# Patient Record
Sex: Female | Born: 1950 | Race: White | Hispanic: No | Marital: Single
Health system: Southern US, Community
[De-identification: ages and names within clinical notes are randomized; demographics above are authoritative.]

---

## 2008-03-29 ENCOUNTER — Ambulatory Visit: Payer: Self-pay | Admitting: Family Medicine

## 2008-04-04 ENCOUNTER — Ambulatory Visit: Payer: Self-pay | Admitting: Internal Medicine

## 2009-06-28 ENCOUNTER — Ambulatory Visit: Payer: Self-pay | Admitting: Unknown Physician Specialty

## 2011-03-28 ENCOUNTER — Ambulatory Visit: Payer: Self-pay | Admitting: Family Medicine

## 2012-02-16 ENCOUNTER — Ambulatory Visit: Payer: Self-pay | Admitting: Family Medicine

## 2012-02-21 ENCOUNTER — Ambulatory Visit: Payer: Self-pay

## 2012-02-21 LAB — CBC WITH DIFFERENTIAL/PLATELET
Basophil #: 0 10*3/uL (ref 0.0–0.1)
Basophil %: 0.5 %
Eosinophil #: 0.1 10*3/uL (ref 0.0–0.7)
Eosinophil %: 1.6 %
HCT: 37.4 % (ref 35.0–47.0)
Lymphocyte #: 1 10*3/uL (ref 1.0–3.6)
Lymphocyte %: 16 %
MCH: 30 pg (ref 26.0–34.0)
MCHC: 32.9 g/dL (ref 32.0–36.0)
MCV: 91 fL (ref 80–100)
Monocyte #: 0.5 x10 3/mm (ref 0.2–0.9)
Neutrophil #: 4.8 10*3/uL (ref 1.4–6.5)
Platelet: 311 10*3/uL (ref 150–440)
RBC: 4.09 10*6/uL (ref 3.80–5.20)
WBC: 6.5 10*3/uL (ref 3.6–11.0)

## 2012-02-21 LAB — LIPID PANEL
Cholesterol: 179 mg/dL (ref 0–200)
Ldl Cholesterol, Calc: 78 mg/dL (ref 0–100)
Triglycerides: 138 mg/dL (ref 0–200)

## 2012-02-21 LAB — COMPREHENSIVE METABOLIC PANEL
BUN: 14 mg/dL (ref 7–18)
Chloride: 98 mmol/L (ref 98–107)
Co2: 28 mmol/L (ref 21–32)
Creatinine: 0.81 mg/dL (ref 0.60–1.30)
EGFR (African American): >60
EGFR (Non-African Amer.): >60
Glucose: 92 mg/dL (ref 65–99)
Potassium: 4.6 mmol/L (ref 3.5–5.1)
SGOT(AST): 22 U/L (ref 15–37)
SGPT (ALT): 15 U/L
Total Protein: 6.9 g/dL (ref 6.4–8.2)

## 2012-02-21 LAB — FERRITIN: Ferritin (ARMC): 160 ng/mL (ref 8–388)

## 2012-02-21 LAB — FOLATE: Folic Acid: 19.6 ng/mL (ref 3.1–100.0)

## 2012-02-21 LAB — HEMOGLOBIN A1C: Hemoglobin A1C: 5.1 % (ref 4.2–6.3)

## 2012-07-22 ENCOUNTER — Ambulatory Visit: Payer: Self-pay

## 2012-07-22 LAB — COMPREHENSIVE METABOLIC PANEL
Albumin: 3.2 g/dL — ABNORMAL LOW (ref 3.4–5.0)
Alkaline Phosphatase: 117 U/L (ref 50–136)
Anion Gap: 7 (ref 7–16)
BUN: 15 mg/dL (ref 7–18)
Calcium, Total: 9.4 mg/dL (ref 8.5–10.1)
Chloride: 103 mmol/L (ref 98–107)
Glucose: 89 mg/dL (ref 65–99)
Potassium: 4 mmol/L (ref 3.5–5.1)
SGOT(AST): 14 U/L — ABNORMAL LOW (ref 15–37)
SGPT (ALT): 17 U/L (ref 12–78)
Total Protein: 6.9 g/dL (ref 6.4–8.2)

## 2012-07-22 LAB — CBC WITH DIFFERENTIAL/PLATELET
Basophil %: 0.7 %
Eosinophil %: 1.9 %
HGB: 11.7 g/dL — ABNORMAL LOW (ref 12.0–16.0)
Lymphocyte #: 1.5 10*3/uL (ref 1.0–3.6)
MCH: 31.7 pg (ref 26.0–34.0)
MCV: 94 fL (ref 80–100)
Monocyte #: 0.4 x10 3/mm (ref 0.2–0.9)
Monocyte %: 8.3 %
Neutrophil #: 2.6 10*3/uL (ref 1.4–6.5)
Platelet: 322 10*3/uL (ref 150–440)
RBC: 3.7 10*6/uL — ABNORMAL LOW (ref 3.80–5.20)

## 2012-07-22 LAB — LIPID PANEL
Cholesterol: 182 mg/dL (ref 0–200)
HDL Cholesterol: 82 mg/dL — ABNORMAL HIGH (ref 40–60)
Triglycerides: 101 mg/dL (ref 0–200)

## 2012-07-22 LAB — HEMOGLOBIN A1C: Hemoglobin A1C: 5.5 % (ref 4.2–6.3)

## 2012-07-22 LAB — FOLATE: Folic Acid: 18.8 ng/mL (ref 3.1–100.0)

## 2013-01-17 ENCOUNTER — Emergency Department: Payer: Self-pay | Admitting: Internal Medicine

## 2013-01-17 LAB — COMPREHENSIVE METABOLIC PANEL
Alkaline Phosphatase: 100 U/L (ref 50–136)
BUN: 11 mg/dL (ref 7–18)
Bilirubin,Total: 0.7 mg/dL (ref 0.2–1.0)
Calcium, Total: 9.6 mg/dL (ref 8.5–10.1)
Creatinine: 0.94 mg/dL (ref 0.60–1.30)
EGFR (African American): >60
EGFR (Non-African Amer.): >60
Glucose: 91 mg/dL (ref 65–99)
Potassium: 3.5 mmol/L (ref 3.5–5.1)
SGOT(AST): 22 U/L (ref 15–37)
SGPT (ALT): 21 U/L (ref 12–78)
Sodium: 142 mmol/L (ref 136–145)

## 2013-01-17 LAB — URINALYSIS, COMPLETE
Bacteria: NONE SEEN
Bilirubin,UR: NEGATIVE
Leukocyte Esterase: NEGATIVE
Nitrite: NEGATIVE
Ph: 8 (ref 4.5–8.0)
Protein: NEGATIVE
Squamous Epithelial: <1
WBC UR: 4 /HPF (ref 0–5)

## 2013-01-17 LAB — CBC
HGB: 14.1 g/dL (ref 12.0–16.0)
MCH: 31.1 pg (ref 26.0–34.0)
MCHC: 34.1 g/dL (ref 32.0–36.0)
MCV: 91 fL (ref 80–100)
RDW: 13.5 % (ref 11.5–14.5)
WBC: 7 10*3/uL (ref 3.6–11.0)

## 2013-11-20 ENCOUNTER — Ambulatory Visit: Payer: Self-pay | Admitting: Gynecologic Oncology

## 2013-11-24 ENCOUNTER — Ambulatory Visit: Payer: Self-pay | Admitting: Gynecologic Oncology

## 2013-11-26 LAB — CA 125: CA 125: 11.8 U/mL (ref 0.0–34.0)

## 2013-12-01 ENCOUNTER — Ambulatory Visit: Payer: Self-pay | Admitting: Gynecologic Oncology

## 2013-12-01 LAB — CBC
HCT: 33.2 % — AB (ref 35.0–47.0)
HGB: 11.3 g/dL — AB (ref 12.0–16.0)
MCH: 32.4 pg (ref 26.0–34.0)
MCHC: 34.2 g/dL (ref 32.0–36.0)
MCV: 95 fL (ref 80–100)
Platelet: 305 10*3/uL (ref 150–440)
RBC: 3.5 10*6/uL — ABNORMAL LOW (ref 3.80–5.20)
RDW: 12.8 % (ref 11.5–14.5)
WBC: 6.1 10*3/uL (ref 3.6–11.0)

## 2013-12-01 LAB — BASIC METABOLIC PANEL
Anion Gap: 5 — ABNORMAL LOW (ref 7–16)
BUN: 10 mg/dL (ref 7–18)
CO2: 27 mmol/L (ref 21–32)
Calcium, Total: 9.2 mg/dL (ref 8.5–10.1)
Chloride: 106 mmol/L (ref 98–107)
Creatinine: 0.74 mg/dL (ref 0.60–1.30)
EGFR (African American): >60
EGFR (Non-African Amer.): >60
Glucose: 92 mg/dL (ref 65–99)
Osmolality: 274 (ref 275–301)
Potassium: 4.3 mmol/L (ref 3.5–5.1)
SODIUM: 138 mmol/L (ref 136–145)

## 2013-12-06 ENCOUNTER — Ambulatory Visit: Payer: Self-pay | Admitting: Gynecologic Oncology

## 2013-12-08 ENCOUNTER — Inpatient Hospital Stay: Payer: Self-pay | Admitting: Obstetrics & Gynecology

## 2013-12-09 LAB — CREATININE, SERUM
Creatinine: 0.95 mg/dL (ref 0.60–1.30)
EGFR (Non-African Amer.): >60

## 2013-12-09 LAB — HEMOGLOBIN: HGB: 12.2 g/dL (ref 12.0–16.0)

## 2013-12-16 LAB — PATHOLOGY REPORT

## 2014-01-03 ENCOUNTER — Ambulatory Visit: Payer: Self-pay | Admitting: Gynecologic Oncology

## 2014-01-04 ENCOUNTER — Ambulatory Visit: Payer: Self-pay | Admitting: Gynecologic Oncology

## 2014-01-13 ENCOUNTER — Ambulatory Visit: Payer: Self-pay | Admitting: Vascular Surgery

## 2014-01-21 LAB — CBC CANCER CENTER
BASOS ABS: 0 x10 3/mm (ref 0.0–0.1)
Basophil %: 0.7 %
EOS PCT: 8.9 %
Eosinophil #: 0.5 x10 3/mm (ref 0.0–0.7)
HCT: 32.5 % — AB (ref 35.0–47.0)
HGB: 10.8 g/dL — ABNORMAL LOW (ref 12.0–16.0)
Lymphocyte #: 1.9 x10 3/mm (ref 1.0–3.6)
Lymphocyte %: 37.3 %
MCH: 31 pg (ref 26.0–34.0)
MCHC: 33.1 g/dL (ref 32.0–36.0)
MCV: 94 fL (ref 80–100)
MONOS PCT: 11 %
Monocyte #: 0.6 x10 3/mm (ref 0.2–0.9)
NEUTROS ABS: 2.2 x10 3/mm (ref 1.4–6.5)
Neutrophil %: 42.1 %
PLATELETS: 290 x10 3/mm (ref 150–440)
RBC: 3.47 10*6/uL — AB (ref 3.80–5.20)
RDW: 13.6 % (ref 11.5–14.5)
WBC: 5.1 x10 3/mm (ref 3.6–11.0)

## 2014-01-21 LAB — COMPREHENSIVE METABOLIC PANEL
ALT: 17 U/L (ref 12–78)
Albumin: 3.3 g/dL — ABNORMAL LOW (ref 3.4–5.0)
Alkaline Phosphatase: 89 U/L
Anion Gap: 8 (ref 7–16)
BILIRUBIN TOTAL: 0.2 mg/dL (ref 0.2–1.0)
BUN: 12 mg/dL (ref 7–18)
CALCIUM: 9.2 mg/dL (ref 8.5–10.1)
CREATININE: 0.83 mg/dL (ref 0.60–1.30)
Chloride: 102 mmol/L (ref 98–107)
Co2: 28 mmol/L (ref 21–32)
EGFR (Non-African Amer.): >60
GLUCOSE: 102 mg/dL — AB (ref 65–99)
OSMOLALITY: 276 (ref 275–301)
Potassium: 4 mmol/L (ref 3.5–5.1)
SGOT(AST): 17 U/L (ref 15–37)
SODIUM: 138 mmol/L (ref 136–145)
TOTAL PROTEIN: 6.7 g/dL (ref 6.4–8.2)

## 2014-01-23 LAB — CA 125: CA 125: 6.4 U/mL (ref 0.0–34.0)

## 2014-01-28 LAB — CBC CANCER CENTER
Basophil #: 0 x10 3/mm (ref 0.0–0.1)
Basophil %: 0.6 %
EOS PCT: 15.3 %
Eosinophil #: 0.5 x10 3/mm (ref 0.0–0.7)
HCT: 30.3 % — ABNORMAL LOW (ref 35.0–47.0)
HGB: 10.2 g/dL — AB (ref 12.0–16.0)
LYMPHS ABS: 1.3 x10 3/mm (ref 1.0–3.6)
Lymphocyte %: 38 %
MCH: 31.7 pg (ref 26.0–34.0)
MCHC: 33.8 g/dL (ref 32.0–36.0)
MCV: 94 fL (ref 80–100)
MONO ABS: 0.1 x10 3/mm — AB (ref 0.2–0.9)
MONOS PCT: 2.4 %
NEUTROS ABS: 1.4 x10 3/mm (ref 1.4–6.5)
NEUTROS PCT: 43.7 %
Platelet: 203 x10 3/mm (ref 150–440)
RBC: 3.23 10*6/uL — ABNORMAL LOW (ref 3.80–5.20)
RDW: 12.9 % (ref 11.5–14.5)
WBC: 3.3 x10 3/mm — ABNORMAL LOW (ref 3.6–11.0)

## 2014-01-29 LAB — COMPREHENSIVE METABOLIC PANEL
ALT: 23 U/L (ref 12–78)
ANION GAP: 6 — AB (ref 7–16)
Albumin: 3.1 g/dL — ABNORMAL LOW (ref 3.4–5.0)
Alkaline Phosphatase: 83 U/L
BILIRUBIN TOTAL: 0.1 mg/dL — AB (ref 0.2–1.0)
BUN: 13 mg/dL (ref 7–18)
CALCIUM: 8.7 mg/dL (ref 8.5–10.1)
Chloride: 106 mmol/L (ref 98–107)
Co2: 31 mmol/L (ref 21–32)
Creatinine: 0.76 mg/dL (ref 0.60–1.30)
EGFR (African American): >60
GLUCOSE: 102 mg/dL — AB (ref 65–99)
Osmolality: 285 (ref 275–301)
Potassium: 3.9 mmol/L (ref 3.5–5.1)
SGOT(AST): 20 U/L (ref 15–37)
Sodium: 143 mmol/L (ref 136–145)
Total Protein: 6.2 g/dL — ABNORMAL LOW (ref 6.4–8.2)

## 2014-02-03 ENCOUNTER — Ambulatory Visit: Payer: Self-pay | Admitting: Gynecologic Oncology

## 2014-02-04 LAB — CBC CANCER CENTER
BASOS ABS: 0 x10 3/mm (ref 0.0–0.1)
Basophil %: 0.6 %
Eosinophil #: 0.1 x10 3/mm (ref 0.0–0.7)
Eosinophil %: 5.8 %
HCT: 27.9 % — ABNORMAL LOW (ref 35.0–47.0)
HGB: 9.4 g/dL — ABNORMAL LOW (ref 12.0–16.0)
Lymphocyte #: 1 x10 3/mm (ref 1.0–3.6)
Lymphocyte %: 46.7 %
MCH: 31.6 pg (ref 26.0–34.0)
MCHC: 33.7 g/dL (ref 32.0–36.0)
MCV: 94 fL (ref 80–100)
Monocyte #: 0.6 x10 3/mm (ref 0.2–0.9)
Monocyte %: 26.1 %
NEUTROS PCT: 20.8 %
Neutrophil #: 0.4 x10 3/mm — ABNORMAL LOW (ref 1.4–6.5)
PLATELETS: 131 x10 3/mm — AB (ref 150–440)
RBC: 2.98 10*6/uL — ABNORMAL LOW (ref 3.80–5.20)
RDW: 13 % (ref 11.5–14.5)
WBC: 2.1 x10 3/mm — ABNORMAL LOW (ref 3.6–11.0)

## 2014-02-11 LAB — COMPREHENSIVE METABOLIC PANEL
ALT: 13 U/L (ref 12–78)
Albumin: 3 g/dL — ABNORMAL LOW (ref 3.4–5.0)
Alkaline Phosphatase: 93 U/L
Anion Gap: 6 — ABNORMAL LOW (ref 7–16)
BUN: 10 mg/dL (ref 7–18)
Bilirubin,Total: 0.2 mg/dL (ref 0.2–1.0)
CALCIUM: 8.8 mg/dL (ref 8.5–10.1)
Chloride: 106 mmol/L (ref 98–107)
Co2: 29 mmol/L (ref 21–32)
Creatinine: 0.77 mg/dL (ref 0.60–1.30)
EGFR (African American): >60
EGFR (Non-African Amer.): >60
GLUCOSE: 92 mg/dL (ref 65–99)
Osmolality: 280 (ref 275–301)
Potassium: 4.1 mmol/L (ref 3.5–5.1)
SGOT(AST): 12 U/L — ABNORMAL LOW (ref 15–37)
Sodium: 141 mmol/L (ref 136–145)
Total Protein: 6.3 g/dL — ABNORMAL LOW (ref 6.4–8.2)

## 2014-02-11 LAB — CBC CANCER CENTER
BASOS PCT: 0.5 %
Basophil #: 0 x10 3/mm (ref 0.0–0.1)
Eosinophil #: 0.1 x10 3/mm (ref 0.0–0.7)
Eosinophil %: 1.6 %
HCT: 28.7 % — AB (ref 35.0–47.0)
HGB: 9.5 g/dL — ABNORMAL LOW (ref 12.0–16.0)
LYMPHS ABS: 1.7 x10 3/mm (ref 1.0–3.6)
Lymphocyte %: 48.5 %
MCH: 31 pg (ref 26.0–34.0)
MCHC: 33 g/dL (ref 32.0–36.0)
MCV: 94 fL (ref 80–100)
Monocyte #: 0.4 x10 3/mm (ref 0.2–0.9)
Monocyte %: 12.3 %
NEUTROS ABS: 1.3 x10 3/mm — AB (ref 1.4–6.5)
Neutrophil %: 37.1 %
PLATELETS: 360 x10 3/mm (ref 150–440)
RBC: 3.06 10*6/uL — AB (ref 3.80–5.20)
RDW: 13.5 % (ref 11.5–14.5)
WBC: 3.4 x10 3/mm — ABNORMAL LOW (ref 3.6–11.0)

## 2014-03-05 ENCOUNTER — Ambulatory Visit: Payer: Self-pay | Admitting: Gynecologic Oncology

## 2014-03-08 LAB — COMPREHENSIVE METABOLIC PANEL
ALBUMIN: 3.7 g/dL (ref 3.4–5.0)
Alkaline Phosphatase: 117 U/L
Anion Gap: 11 (ref 7–16)
BILIRUBIN TOTAL: 0.3 mg/dL (ref 0.2–1.0)
BUN: 11 mg/dL (ref 7–18)
Calcium, Total: 9.3 mg/dL (ref 8.5–10.1)
Chloride: 101 mmol/L (ref 98–107)
Co2: 27 mmol/L (ref 21–32)
Creatinine: 0.99 mg/dL (ref 0.60–1.30)
GLUCOSE: 108 mg/dL — AB (ref 65–99)
Osmolality: 277 (ref 275–301)
Potassium: 3.7 mmol/L (ref 3.5–5.1)
SGOT(AST): 21 U/L (ref 15–37)
SGPT (ALT): 24 U/L (ref 12–78)
Sodium: 139 mmol/L (ref 136–145)
TOTAL PROTEIN: 7.3 g/dL (ref 6.4–8.2)

## 2014-03-08 LAB — CBC CANCER CENTER
Basophil #: 0 x10 3/mm (ref 0.0–0.1)
Basophil %: 0.5 %
EOS ABS: 0 x10 3/mm (ref 0.0–0.7)
Eosinophil %: 0.6 %
HCT: 29.6 % — ABNORMAL LOW (ref 35.0–47.0)
HGB: 10.5 g/dL — ABNORMAL LOW (ref 12.0–16.0)
LYMPHS ABS: 1.9 x10 3/mm (ref 1.0–3.6)
Lymphocyte %: 36.1 %
MCH: 32.9 pg (ref 26.0–34.0)
MCHC: 35.3 g/dL (ref 32.0–36.0)
MCV: 93 fL (ref 80–100)
MONOS PCT: 8.2 %
Monocyte #: 0.4 x10 3/mm (ref 0.2–0.9)
Neutrophil #: 2.9 x10 3/mm (ref 1.4–6.5)
Neutrophil %: 54.6 %
PLATELETS: 502 x10 3/mm — AB (ref 150–440)
RBC: 3.18 10*6/uL — ABNORMAL LOW (ref 3.80–5.20)
RDW: 17.6 % — ABNORMAL HIGH (ref 11.5–14.5)
WBC: 5.2 x10 3/mm (ref 3.6–11.0)

## 2014-03-09 ENCOUNTER — Emergency Department: Payer: Self-pay | Admitting: Internal Medicine

## 2014-03-10 LAB — COMPREHENSIVE METABOLIC PANEL
ALK PHOS: 99 U/L
ALT: 22 U/L (ref 12–78)
Albumin: 3.6 g/dL (ref 3.4–5.0)
Anion Gap: 12 (ref 7–16)
BILIRUBIN TOTAL: 0.9 mg/dL (ref 0.2–1.0)
BUN: 14 mg/dL (ref 7–18)
Calcium, Total: 8.7 mg/dL (ref 8.5–10.1)
Chloride: 105 mmol/L (ref 98–107)
Co2: 24 mmol/L (ref 21–32)
Creatinine: 0.82 mg/dL (ref 0.60–1.30)
EGFR (African American): >60
Glucose: 132 mg/dL — ABNORMAL HIGH (ref 65–99)
Osmolality: 284 (ref 275–301)
Potassium: 3.4 mmol/L — ABNORMAL LOW (ref 3.5–5.1)
SGOT(AST): 17 U/L (ref 15–37)
SODIUM: 141 mmol/L (ref 136–145)
Total Protein: 6.8 g/dL (ref 6.4–8.2)

## 2014-03-10 LAB — CBC CANCER CENTER
BASOS ABS: 0 x10 3/mm (ref 0.0–0.1)
Basophil %: 0.2 %
EOS ABS: 0 x10 3/mm (ref 0.0–0.7)
EOS PCT: 0 %
HCT: 28 % — AB (ref 35.0–47.0)
HGB: 9.9 g/dL — AB (ref 12.0–16.0)
LYMPHS ABS: 0.2 x10 3/mm — AB (ref 1.0–3.6)
LYMPHS PCT: 4.5 %
MCH: 32.9 pg (ref 26.0–34.0)
MCHC: 35.4 g/dL (ref 32.0–36.0)
MCV: 93 fL (ref 80–100)
Monocyte #: 0.2 x10 3/mm (ref 0.2–0.9)
Monocyte %: 3.2 %
NEUTROS ABS: 4.6 x10 3/mm (ref 1.4–6.5)
NEUTROS PCT: 92.1 %
PLATELETS: 458 x10 3/mm — AB (ref 150–440)
RBC: 3.01 10*6/uL — AB (ref 3.80–5.20)
RDW: 18.1 % — AB (ref 11.5–14.5)
WBC: 5 x10 3/mm (ref 3.6–11.0)

## 2014-03-10 LAB — MAGNESIUM: MAGNESIUM: 1.7 mg/dL — AB

## 2014-04-05 ENCOUNTER — Ambulatory Visit: Payer: Self-pay | Admitting: Gynecologic Oncology

## 2014-05-05 ENCOUNTER — Ambulatory Visit: Payer: Self-pay | Admitting: Gynecologic Oncology

## 2014-06-11 ENCOUNTER — Ambulatory Visit: Payer: Self-pay | Admitting: Oncology

## 2014-07-06 ENCOUNTER — Ambulatory Visit: Payer: Self-pay | Admitting: Oncology

## 2014-07-08 ENCOUNTER — Ambulatory Visit: Payer: Self-pay | Admitting: Obstetrics and Gynecology

## 2014-09-05 ENCOUNTER — Ambulatory Visit: Payer: Self-pay | Admitting: Nurse Practitioner

## 2014-09-06 IMAGING — CT CT ABD-PELV W/ CM
2 of 5 series · 16 of 46 positions shown, 18 images · IV contrast (isovue)
Comparison: 11/26/2013

CLINICAL DATA: Vaginal bleeding. Pelvic and back pain. Weight loss,
nausea. History of uterine cancer diagnosed 5782. Post hysterectomy
and chemotherapy.

EXAM:
CT ABDOMEN AND PELVIS WITH CONTRAST
TECHNIQUE: Multidetector CT imaging of the abdomen and pelvis was performed
using the standard protocol following bolus administration of
intravenous contrast.
CONTRAST:  100 cc Isovue 300 IV.

[Series 2: axial soft tissue · axial · 0.78mm/px · z∈[-519,-154]mm · 13 of 83 slices shown, 15 images]
[im 5/83  soft-tissue]
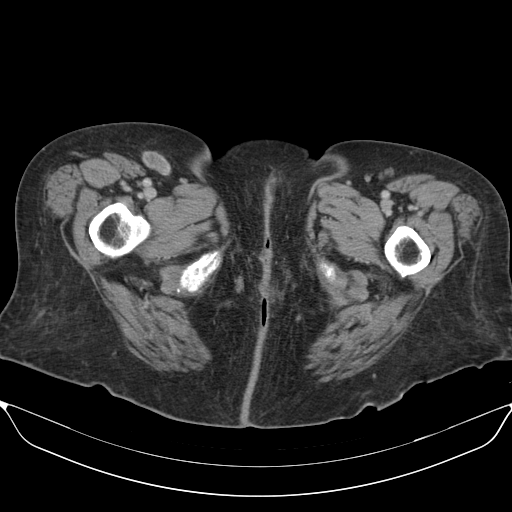
[im 5/83  bone]
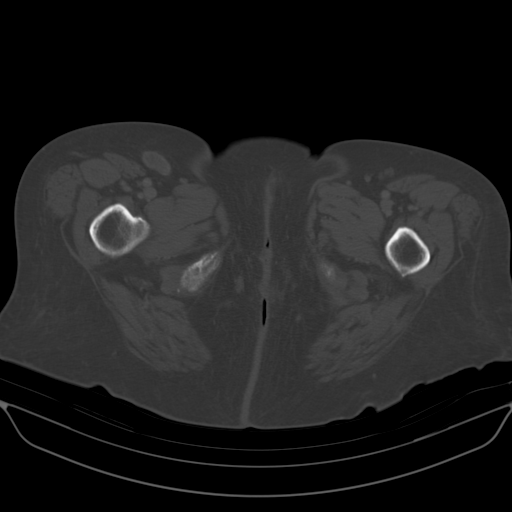
[im 13/83  soft-tissue]
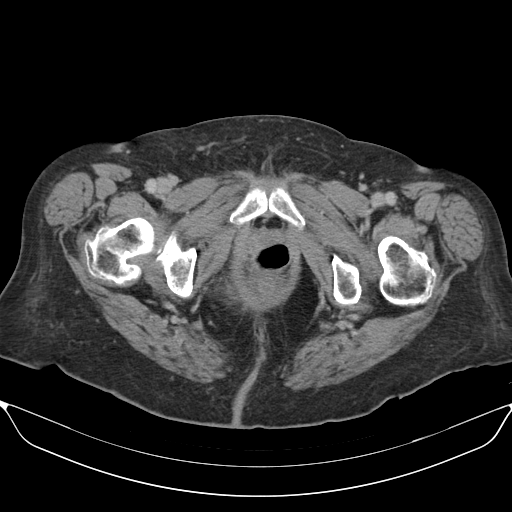
[im 18/83  soft-tissue]
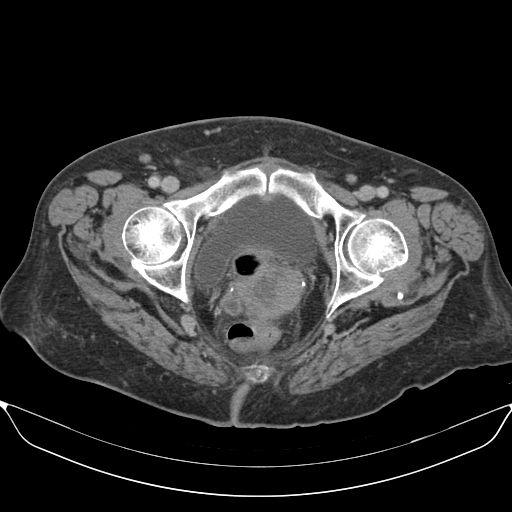
[im 22/83  soft-tissue]
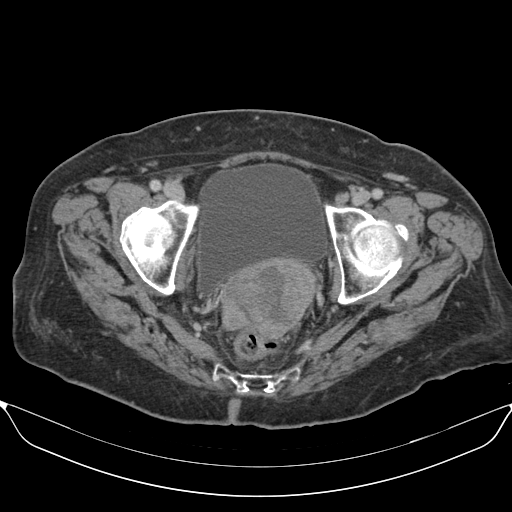
[im 31/83  soft-tissue]
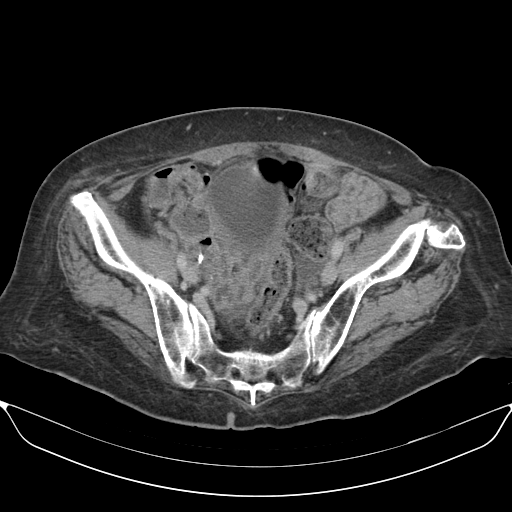
[im 35/83  soft-tissue]
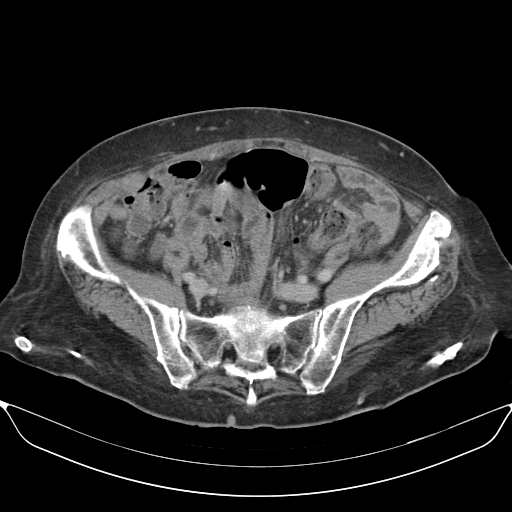
[im 44/83  soft-tissue]
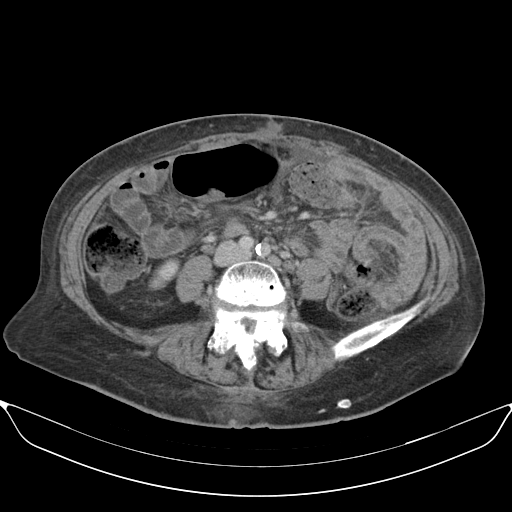
[im 48/83  soft-tissue]
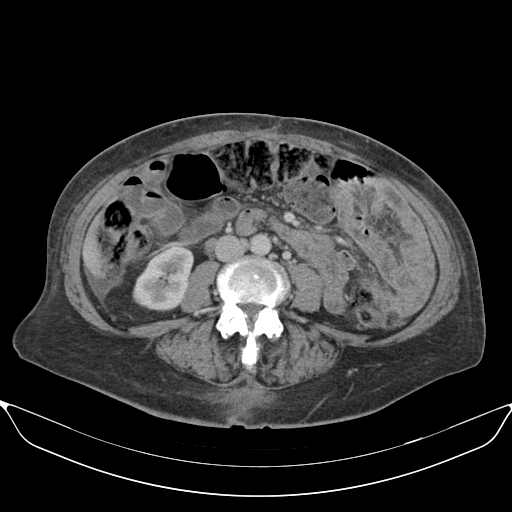
[im 52/83  soft-tissue]
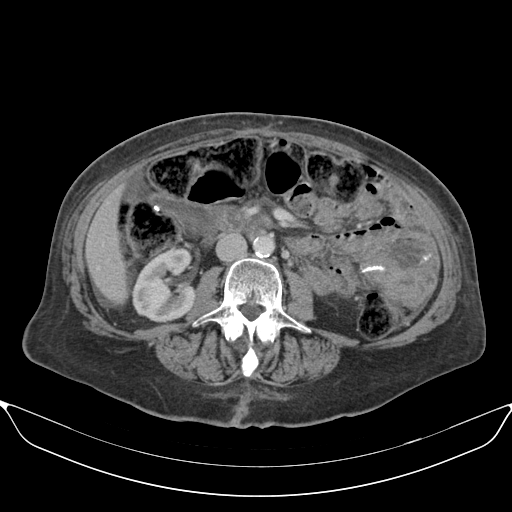
[im 52/83  bone]
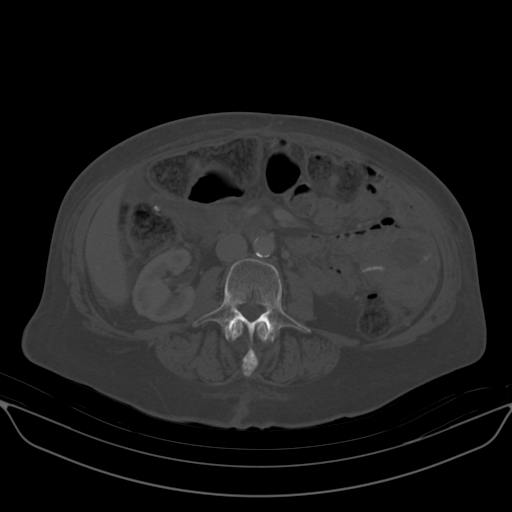
[im 61/83  soft-tissue]
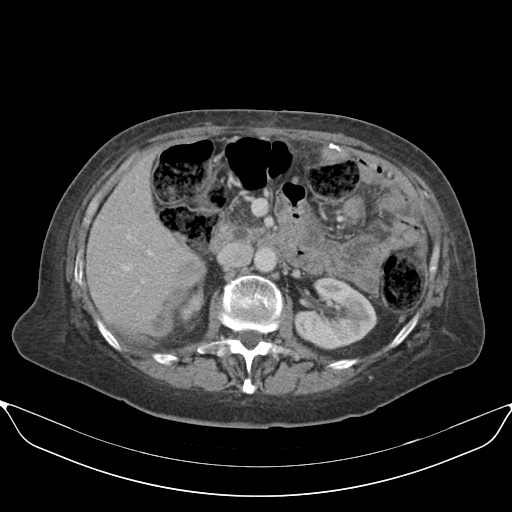
[im 65/83  soft-tissue]
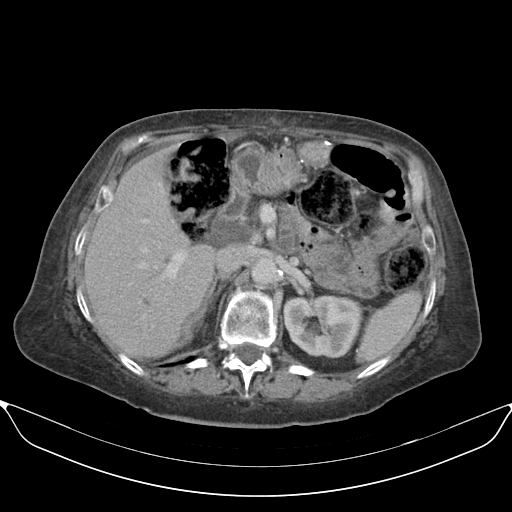
[im 70/83  soft-tissue]
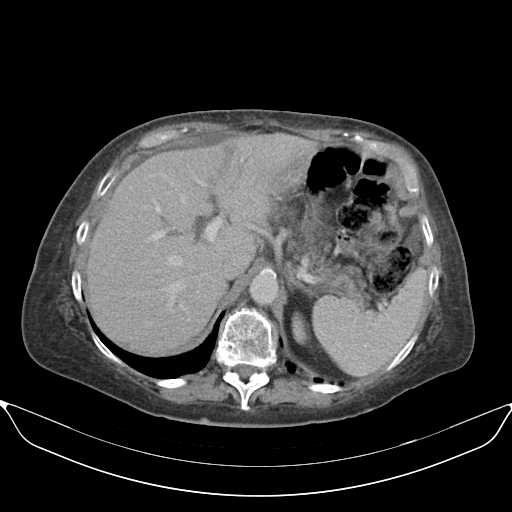
[im 78/83  soft-tissue]
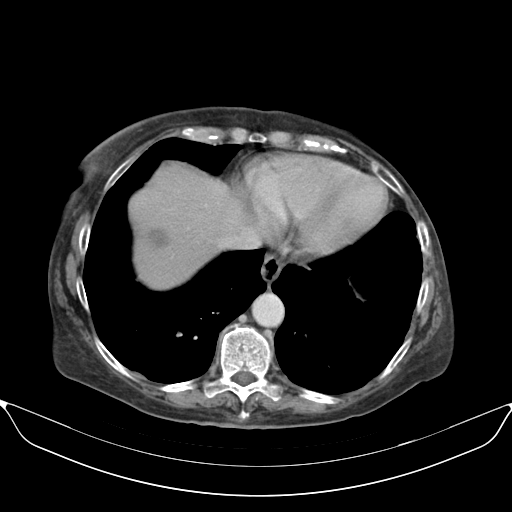

[Series 602: coronals · coronal · 0.80mm/px · 3 of 132 slices shown]
[im 44/132  soft-tissue]
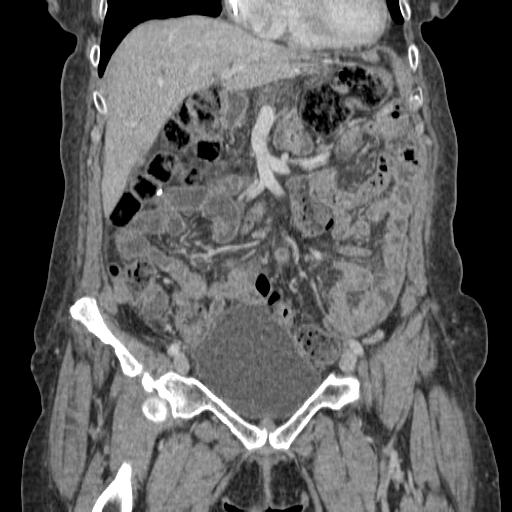
[im 59/132  soft-tissue]
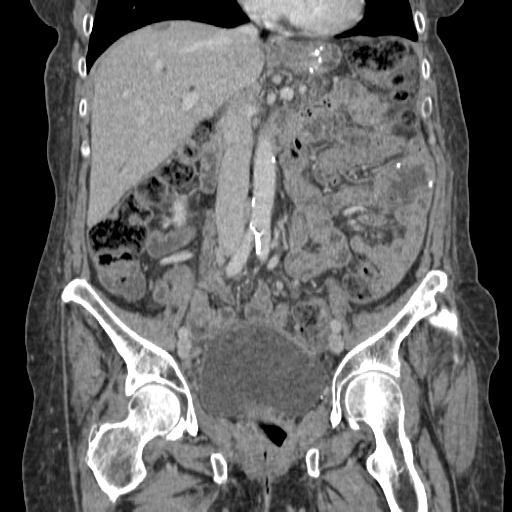
[im 73/132  soft-tissue]
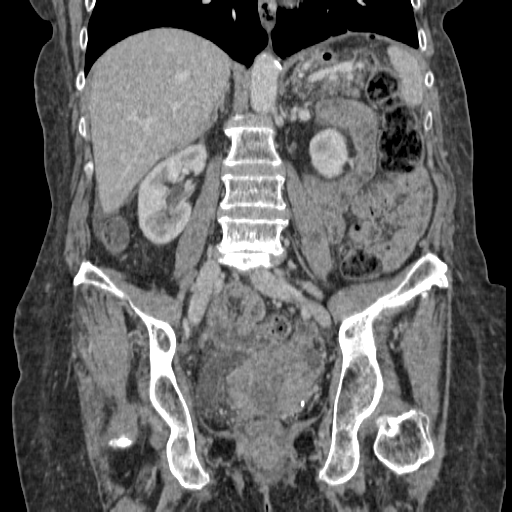

[16 of 46 positions shown; findings below may reference images not displayed]

FINDINGS: Lung bases are clear.  No effusions.  Heart is normal size.

Small low-density lesion anteriorly within the liver measures 10 mm
on image 11, stable. Small hypodensity posteriorly in the right
hepatic lobe measures 6 mm on image 19, stable since prior study.
Spleen, pancreas, adrenals and kidneys are unremarkable.

Postoperative changes in the stomach and small bowel, presumably
from prior gastric bypass. Stable mild biliary ductal dilatation.
Common bile duct measures up to 13 mm.

Abnormal low-density lesions noted along the liver edge compatible
with serosal metastases. These are best seen on image 24 and image
26. Index serosal metastasis on image 24 measures up to 2.2 cm.

Large heterogeneous mixed solid and cystic mass with in the
cul-de-sac of the pelvis measures 7.5 x 6.1 cm compatible with
recurrence. Mesenteric nodularity centrally measures 12 mm on image
41. Mesentery nodularity on image 23 in the left abdomen measures up
to 16 mm. Small amount of free fluid adjacent to the liver and in
the cul-de-sac.

Moderate stool burden throughout the colon. Small bowel is
decompressed.

Degenerative disc and facet disease in the lumbar spine. Mild
scratch sec grade 1 anterolisthesis of L4 on L5.
IMPRESSION: Large mixed solid and cystic mass within the cul-de-sac of the
pelvis measured up to 7.5 cm compatible with tumor recurrence.

Serosal mixed solid and cystic masses along the liver surface
compatible with peritoneal metastases. Scattered mesenteric
nodularity/adenopathy. Small amount of free fluid in the abdomen and
pelvis.

Two small hepatic lesions, stable since prior study, nonspecific.

## 2014-09-30 ENCOUNTER — Inpatient Hospital Stay: Payer: Self-pay | Admitting: Internal Medicine

## 2014-09-30 LAB — COMPREHENSIVE METABOLIC PANEL
ALK PHOS: 102 U/L
AST: 17 U/L (ref 15–37)
Albumin: 1.3 g/dL — ABNORMAL LOW (ref 3.4–5.0)
Anion Gap: 5 — ABNORMAL LOW (ref 7–16)
BILIRUBIN TOTAL: 0.3 mg/dL (ref 0.2–1.0)
BUN: 28 mg/dL — AB (ref 7–18)
CALCIUM: 8.3 mg/dL — AB (ref 8.5–10.1)
CHLORIDE: 102 mmol/L (ref 98–107)
Co2: 27 mmol/L (ref 21–32)
Creatinine: 0.99 mg/dL (ref 0.60–1.30)
EGFR (African American): >60
EGFR (Non-African Amer.): >60
GLUCOSE: 103 mg/dL — AB (ref 65–99)
OSMOLALITY: 274 (ref 275–301)
POTASSIUM: 4.5 mmol/L (ref 3.5–5.1)
SGPT (ALT): 13 U/L — ABNORMAL LOW
SODIUM: 134 mmol/L — AB (ref 136–145)
TOTAL PROTEIN: 4.8 g/dL — AB (ref 6.4–8.2)

## 2014-09-30 LAB — CBC
HCT: 33.3 % — ABNORMAL LOW (ref 35.0–47.0)
HGB: 10.7 g/dL — ABNORMAL LOW (ref 12.0–16.0)
MCH: 28.8 pg (ref 26.0–34.0)
MCHC: 32.3 g/dL (ref 32.0–36.0)
MCV: 89 fL (ref 80–100)
Platelet: 573 10*3/uL — ABNORMAL HIGH (ref 150–440)
RBC: 3.73 10*6/uL — ABNORMAL LOW (ref 3.80–5.20)
RDW: 15 % — ABNORMAL HIGH (ref 11.5–14.5)
WBC: 11.1 10*3/uL — ABNORMAL HIGH (ref 3.6–11.0)

## 2014-10-01 LAB — BASIC METABOLIC PANEL
Anion Gap: 5 — ABNORMAL LOW (ref 7–16)
BUN: 28 mg/dL — AB (ref 7–18)
CHLORIDE: 105 mmol/L (ref 98–107)
Calcium, Total: 7.8 mg/dL — ABNORMAL LOW (ref 8.5–10.1)
Co2: 27 mmol/L (ref 21–32)
Creatinine: 0.83 mg/dL (ref 0.60–1.30)
EGFR (Non-African Amer.): >60
GLUCOSE: 76 mg/dL (ref 65–99)
Osmolality: 278 (ref 275–301)
POTASSIUM: 4.2 mmol/L (ref 3.5–5.1)
Sodium: 137 mmol/L (ref 136–145)

## 2014-10-01 LAB — CBC WITH DIFFERENTIAL/PLATELET
Basophil #: 0 10*3/uL (ref 0.0–0.1)
Basophil %: 0.2 %
EOS ABS: 0 10*3/uL (ref 0.0–0.7)
EOS PCT: 0.1 %
HCT: 27.9 % — ABNORMAL LOW (ref 35.0–47.0)
HGB: 9.1 g/dL — AB (ref 12.0–16.0)
LYMPHS ABS: 0.3 10*3/uL — AB (ref 1.0–3.6)
LYMPHS PCT: 3.9 %
MCH: 29 pg (ref 26.0–34.0)
MCHC: 32.5 g/dL (ref 32.0–36.0)
MCV: 89 fL (ref 80–100)
MONOS PCT: 9.7 %
Monocyte #: 0.8 x10 3/mm (ref 0.2–0.9)
NEUTROS PCT: 86.1 %
Neutrophil #: 7.4 10*3/uL — ABNORMAL HIGH (ref 1.4–6.5)
Platelet: 423 10*3/uL (ref 150–440)
RBC: 3.12 10*6/uL — AB (ref 3.80–5.20)
RDW: 15.4 % — AB (ref 11.5–14.5)
WBC: 8.5 10*3/uL (ref 3.6–11.0)

## 2014-10-01 LAB — URINALYSIS, COMPLETE
Bilirubin,UR: NEGATIVE
Blood: NEGATIVE
Glucose,UR: NEGATIVE mg/dL (ref 0–75)
KETONE: NEGATIVE
NITRITE: NEGATIVE
Ph: 5 (ref 4.5–8.0)
Protein: 30
RBC,UR: 36 /HPF (ref 0–5)
Specific Gravity: 1.029 (ref 1.003–1.030)
Squamous Epithelial: 6
WBC UR: 46 /HPF (ref 0–5)

## 2014-11-05 DEATH — deceased

## 2015-02-26 NOTE — Op Note (Signed)
PATIENT NAME:  [name] [name] MR#:  [id number] DATE OF BIRTH:  May 11, 1951  DATE OF PROCEDURE:  12/08/2013  SURGEON: Maxine Glenn, MD  PREOPERATIVE DIAGNOSIS: Adenocarcinoma of the endometrium.  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  PROCEDURE PERFORMED: Laparoscopy with robotic assistance for total laparoscopic hysterectomy, bilateral salpingo-oophorectomy, pelvic lymphadenectomy, mini-laparotomy for removal of the uterus and partial omentectomy.   ANESTHESIA: General.   ESTIMATED BLOOD LOSS: 75 mL.   COMPLICATIONS: None.  INDICATION FOR SURGERY: [name] [name] is a 64 year old patient who complained of postmenopausal bleeding, and on endometrial biopsy, was noted to have a type II endometrioid adenocarcinoma. Workup failed to reveal any evidence of metastatic disease. Therefore, a decision was made to proceed with surgery.  OPERATIVE REPORT: After adequate general anesthesia had been obtained, the patient was prepped and draped in ski position. The cervix was visualized, grasped with a single-tooth tenaculum and dilated. A holding stitch was placed. Then, the VCare was inserted into the uterus and around the cervix. The introitus was quite narrow. Then, a Foley catheter was inserted.   Attention was directed towards the abdomen. A 1 cm incision was placed above the umbilicus. The fascia was identified and grasped and transected. The peritoneum was identified and entered. Then, the blunt trocar was inserted. Pneumoperitoneum was obtained, and inspection done with the above-mentioned findings. Then, under direct vision, 2 robotic trocars were inserted into the mid abdomen, a fourth one into the left lower quadrant and a 12 mm assistant port into the right lower quadrant. The patient was placed in steep Trendelenburg position, and the bowel was pushed away from the pelvis. The patient was attached to the robot.   Pelvic cytology was obtained.  Adhesions between sigmoid and left pelvic sidewall were lysed. The round ligament on the left side was cauterized and transected. The pelvic sidewall was entered. Vessel and ureter were identified. The infundibulopelvic ligament was cauterized and transected. The adnexa were mobilized all the way towards the uterus. The same procedure was performed on the right side. Then, the anterior fold of the peritoneum was incised, and the bladder was freed from lower uterine segment, cervix and upper vagina. The posterior peritoneum was incised and freed from lower uterine segment, cervix and upper vagina. The uterosacral ligaments were transected. The uterine vessels were skeletonized, cauterized and transected. Then, the vagina was entered anteriorly, and incision was carried all the way around until uterus and adnexa could be completely mobilized. It was impossible to remove the specimen through the vagina. Therefore, it was placed in an EndoCatch bag and put in the upper abdomen.   Attention was then directed towards the lymphadenectomy, which was done in similar fashion on either side. Using the cauterizing scissors, the node-bearing fatty tissue around lower common iliac and external iliac vessels, from around the hypogastric artery and from the obturator space was removed. Vessels, ureter and obturator nerve were kept under constant visualization. Hemostasis was adequate at the end of the procedure. The vagina was closed with a running V-Loc suture starting on the right side, going all the way to the left and returning with a few back stitches. Adequate hemostasis was confirmed. Biopsies were taken from the pelvic peritoneum.   The patient was then detached from the robot. All trocars were removed under direct vision. There was no evidence of bleeding. The camera port incision was enlarged. Inspection and palpation of the upper abdomen was unremarkable. The bag containing the uterus was removed. Palpation of the  periaortic area was normal, and omental biopsy was taken by dissecting pedicles, which were cut and ligated with 0 Vicryl. Hemostasis was noted to be adequate.   The incision was closed with running #1 looped PDS suture. The subcutaneous tissue was reapproximated with 2-0 Vicryl, and the skin was closed with a 3-0 Monocryl subcuticular stitch. The port sites were reapproximated with 3-0 Vicryl, and the skin was closed with Dermabond.   The patient tolerated the procedure well and was taken to recovery room in stable condition. Postoperative urine was clear. The sponge, needle and instrument counts were correct x2.   ____________________________ Maxine GlennBrigitte E. Zykia Walla, MD bem:lb D: 12/16/2013 07:47:56 ET T: 12/16/2013 08:42:44 ET JOB#: [id number]  cc: Maxine GlennBrigitte E. Ramondo Dietze, MD, <Dictator> Maxine GlennBRIGITTE E Srinika Delone MD ELECTRONICALLY SIGNED 01/05/2014 19:05

## 2015-02-26 NOTE — Discharge Summary (Signed)
PATIENT NAME:  [name] [name], [name] [name] MR#:  [id number] DATE OF BIRTH:  04/09/1951  DATE OF ADMISSION:  09/30/2014 DATE OF DISCHARGE:  10/01/2014  DISCHARGE DIAGNOSIS: Metastatic cervical cancer.   SECONDARY DIAGNOSES: 1. Degenerative joint disease.  2. Chronic myofascial pain syndrome.  3. Uterine cancer.  4. Ovarian cancer.   CONSULTATIONS: Palliative care, Dr. Harriett SineNancy Phifer.   PROCEDURES AND RADIOLOGY: None.   HISTORY AND SHORT HOSPITAL COURSE: The patient is a 64 year old female with the above-mentioned medical problems who was admitted for severe pain all over her body, though to be secondary from metastatic cervical/uterine cancer. Please see Dr. Clarita LeberVasireddy's dictated history and physical for further details. The patient's pain medications were adjusted. Palliative care consultation was obtained with Dr. Harriett SineNancy Phifer and her team who had discussion with the patient who clearly wanted to go under hospice care for comfort measures only. She was appropriate for hospice home and was discharged to hospice home, as a bed was available on the 27th of November, in fair condition.  PHYSICAL EXAMINATION: On the date of discharge: VITAL SIGNS: Her vital signs are as follows: Temperature 98.3, heart rate 107 per minute, respirations 20 per minute, blood pressure 102/68 and she was saturating 95% on room air.   CARDIOVASCULAR: S1, S2 normal. No murmurs, rubs or gallop.   LUNGS: Wheezing throughout both lungs. Decreased breath sounds at the bases.   ABDOMEN: Distended. Ascites present.   GENITOURINARY: Foley catheter was present.   NEUROLOGIC: Nonfocal examination. All other physical examination remained at baseline.   DISCHARGE MEDICATIONS:   Medication Instructions  magic mouthwash  Swish and swallow 5-10 milliliter(s) orally 3-4 times a day.   compazine  10 milligram(s) orally every 6 hours, As Needed - for Nausea, Vomiting   lorazepam 2 mg/ml oral concentrate  0.5 milligram(s) orally every 8  hours, As Needed - for Anxiety   cymbalta 60 mg oral delayed release capsule  1 cap(s) orally once a day   methadone 5 mg/5 ml oral solution  30 milliliter(s) orally every 6 hours   ondansetron 4 mg oral tablet, disintegrating  2 tab(s) sublingual every 12 hours, As Needed - for Nausea, Vomiting   morphine 20 mg/ml oral concentrate  5 milliliter(s) orally every 1 to 2 hours      DISCHARGE DIET: As tolerated.   DISCHARGE ACTIVITY: As tolerated.   DISCHARGE INSTRUCTIONS AND FOLLOWUP:  1. Two liters oxygen via nasal cannula continuous as needed.  2. Foley catheter indwelling for urinary incontinence/prevention of skin breakdown.  3. May drain PleurX as needed for comfort only every 4 to 6 hours.  4. Medication to be crushed or use liquid when appropriate. May change to rectal route if unable to swallow.   TOTAL TIME SPENT: Discharging this patient was 45 minutes.   ____________________________ Ellamae SiaVipul S. Sherryll BurgerShah, MD vss:TT D: 10/02/2014 19:11:08 ET T: 10/02/2014 19:55:33 ET JOB#: [id number]  cc: Maurisha Mongeau S. Sherryll BurgerShah, MD, <Dictator> Ned GraceNancy Phifer, MD Ellamae SiaVIPUL S [facility] MD ELECTRONICALLY SIGNED 10/02/2014 20:44

## 2015-02-26 NOTE — H&P (Signed)
PATIENT NAME:  [name] [name], [name] [name] MR#:  [id number] DATE OF BIRTH:  June 20, 1951  DATE OF ADMISSION:  09/30/2014  PRIMARY CARE PHYSICIAN: Nonlocal.  REFERRING PHYSICIAN: Minna AntisKevin Paduchowski, MD  CHIEF COMPLAINT: Severe pain all over the body.  HISTORY OF PRESENT ILLNESS: [name] [name] is a 64 year old female with known history of metastatic uterine cancer, underwent total abdominal hysterectomy, bilateral salpingo-oophorectomy. The patient decided to quit taking the chemotherapy. [facility] for severe generalized weakness. As well, the patient was following with her GYN specialist. As per the patient, the patient was told that the patient would be discharged to a hospice home. However, the patient was discharged home. The patient was [facility]; however, after being discharged, the patient still continues to experience severe pain. The patient requested to stop the ambulance at [facility] for further care. No obvious abnormalities are noted. The patient states unable to eat, experiencing severe pain. The patient received multiple doses of Dilaudid in the Emergency Department. The patient also has history of chronic pain syndrome. The patient is on multiple pain medications. The patient has severe ascites for which there is a peritoneal drainage placed; per patient removed about 1500 mL yesterday.  PAST MEDICAL HISTORY:  1.  Ovarian CA. 2.  Uterine cancer. 3.  Chronic myofascial pain syndrome. 4.  Degenerative joint disease.  PAST SURGICAL HISTORY:  1.  Arm surgery. 2.  Gastric bypass surgery. 3.  Lumbar diskectomy. 4.  Tonsillectomy/adenoidectomy. 5.  C-section. 6.  Appendectomy. 7.  repair.  ALLERGIES:  1.  PENICILLIN. 2.  CHLORHEXIDINE. 3.  TETANUS.  HOME MEDICATIONS: 1.  Zofran 8 mg every 12 hours as needed. 2.  Methadone 30 mL every 6 hours as needed. 3.  Magic mouthwash swish and swallow. 4.   Lovenox 120 mg 2 times a day. 5.  Lorazepam 1 mg every 8 hours as needed. 6.  Ketamine 18 mg every 4 hours as needed. 7.  Cymbalta 60 mg once a day. 8.  Compazine 10 mg every 6 hours as needed.  SOCIAL HISTORY: No history of smoking, drinking, alcohol abuse or illicit drugs.   FAMILY HISTORY: Cancers.  REVIEW OF SYSTEMS:  CONSTITUTIONAL: Experiencing severe generalized weakness. EYES: No change in vision. ENT: No change in hearing. RESPIRATORY: Has shortness of breath. CARDIOVASCULAR: No chest pain, palpitations. GASTROINTESTINAL: Has no appetite. Has abdominal pain. GENITOURINARY: No dysuria or hematuria. HEMATOLOGIC: No easy bruising or bleeding. SKIN: No rash or lesions. MUSCULOSKELETAL: Has generalized body aches. NEUROLOGIC: No weakness or numbness in any part of the body.  PHYSICAL EXAMINATION: GENERAL: This is a cachetic-looking female lying down in the bed, not in distress. VITAL SIGNS: Temperature 98, pulse 102, blood pressure 114/70, respiratory rate 16, oxygen saturation 98% on room air. HEENT: Head normocephalic, atraumatic. No scleral icterus. Conjunctivae normal. Pupils equal and reactive to light. Bitemporal wasting. Mucous membranes dry. NECK: No lymphadenopathy, JVD, carotid bruit. No thyromegaly.  CHEST: Has no focal tenderness. LUNGS: Bilaterally clear to auscultation.  HEART: S1 and S2 regular. No murmurs or heard. ABDOMEN: Bowel sounds present, soft. Has peritoneal drain in place. Moderate tenderness in the abdomen. EXTREMITIES: No pedal edema. Pulses 2+. SKIN: No rash or lesions. MUSCULOSKELETAL: Good range of motion of all extremities. NEUROLOGIC: The patient is alert and oriented to place, person and time. Cranial nerves II through XII intact. Motor 5/5 in upper and lower extremities.  DIAGNOSTIC DATA: CBC: WBC 3.73, hemoglobin 10.73, platelet  count 373. UA: 1+ leukocyte esterase, WBC 46; the rest of the values are within normal limits.   ASSESSMENT AND  PLAN: [name] [name] is a 64 year old female who comes to the Emergency Department with generalized pain secondary to metastatic cervical cancer.  1.  Cervical cancer. The patient wants herself to be admitted to hospice. We will consult hospice services. The patient would like to go for inpatient hospice. Unsure if the patient will qualify as does not seem to be dying at this time. 2.  Metastatic uterine cancer. The patient refused for the chemotherapy.  3.  Severe debility. The patient does not want to have any further physical therapy. 4.  Keep the patient on deep venous thrombosis prophylaxis with Lovenox.  TIME SPENT: 50 minutes.  ____________________________ [name] Griffins, MD pv:sb D: 10/01/2014 05:03:53 ET T: 10/01/2014 07:02:25 ET JOB#: [id number]  cc: [name] Griffins, MD, <Dictator> [name] Griffins MD ELECTRONICALLY SIGNED 10/01/2014 22:41

## 2015-02-26 NOTE — Op Note (Signed)
PATIENT NAME:  [name] [name], [name] [name] MR#:  [id number] DATE OF BIRTH:  07-Feb-1951  DATE OF PROCEDURE:  01/13/2014  PREOPERATIVE DIAGNOSIS:  Endometrial carcinoma.   POSTOPERATIVE DIAGNOSIS:  Endometrial carcinoma.  PROCEDURE PERFORMED: Insertion right internal jugular Port-A-Cath with ultrasound and fluoroscopic guidance.   SURGEON: Levora DredgeGregory Schnier, MD.   SEDATION: Versed 4 mg plus fentanyl 150 mcg administered IV. Continuous ECG, pulse oximetry and cardiopulmonary monitoring was performed throughout the entire procedure by the interventional radiology nurse.  TOTAL SEDATION TIME:  45 minutes.   ACCESS:  Right IJ.   CONTRAST USED: None.   FLUOROSCOPY TIME: Less than 1 minute.   INDICATIONS FOR PROCEDURE: [name] is a 64 year old woman who presents with endometrial carcinoma and will require chemotherapy and she is therefore must have adequate IV access. Risks and benefits for replacement of an Infuse-a-Port were reviewed. The patient agrees to proceed.   DESCRIPTION OF PROCEDURE: The patient is taken to special procedures and placed in the supine position. After adequate sedation is achieved, her right neck and chest wall are prepped and draped in sterile fashion. Lidocaine 1% is infiltrated in the soft tissues at the base of the neck as well as on the chest wall.   Ultrasound is placed in a sterile sleeve. Ultrasound is utilized secondary to lack of appropriate landmarks and to avoid vascular injury. Under direct ultrasound visualization, the jugular vein is identified. It is echolucent and compressible indicating patency. Image is recorded for the permanent record.   Under direct visualization, Seldinger needle is inserted into the jugular vein and the J-wire is advanced under fluoroscopic guidance so that it passes into the inferior vena cava. A small incision is made at the wire insertion site with a #11 blade scalpel.   A transverse incision is then created 2 fingerbreadths below the  clavicle and a small pocket fashioned with both blunt and sharp dissection. It is tested for appropriate size with the hub of the port.   Tunneling device is then used to pull the catheter subcutaneously from the port incision or pocket to the neck counterincision. Dilator and peel-away sheath is then inserted over the wire. Dilator and wire were removed and the catheter is inserted into the venous system. The peel-away sheath is removed. Under fluoroscopic guidance, the catheter is positioned so that the tip is at the atriocaval junction. It is then transected and the hub is connected. Hub is slipped into the pocket. The port is then accessed percutaneously with a Huber needle. It aspirates and flushes well and is cleared with 20 mL of heparinized saline. Fluoroscopy is used to verify the port, has a tip at the atriocaval junction with a smooth contour. No kinks or other abnormalities and subsequently the pocket is closed with interrupted 3-0 Vicryl, followed by 4-0 Monocryl subcuticular. The neck counterincision is closed with 4-0 Monocryl subcuticular and Dermabond is applied to both incisions. The patient tolerated the procedure well and there were no immediate complications. Sponge and needle counts were correct. She was taken to the recovery area in excellent condition.  ____________________________ Renford DillsGregory G. Schnier, MD ggs:ce D: 01/13/2014 15:28:06 ET T: 01/13/2014 15:58:00 ET JOB#: [id number]  cc: Renford DillsGregory G. Schnier, MD, <Dictator> Gerome SamJanak K. Doylene Canninghoksi, MD Renford DillsGREGORY G SCHNIER MD ELECTRONICALLY SIGNED 01/26/2014 18:46

## 2015-02-26 NOTE — Consult Note (Signed)
Brief Consult Note: Diagnosis: Endometrial Carcinoma.   Patient was seen by consultant.   Orders entered.   Discussed with Attending MD.   Comments: Dr Hilma FavorsBrigette Miller of GynOnc performed surgery today, patient with endometrial cancer.  Patient in need of post op care.  History reviewed from chart, Dr Hyacinth MeekerMiller, and pt's friend Angelique BlonderDenise here tonight.  Patient stable in PACU.  Will admit and follow closely for diet, ambulation, pain control, incision healing.  Electronic Signatures: [name] [name], [name] [name] (MD)  (Signed 03-Feb-15 21:28)  Authored: Brief Consult Note   Last Updated: 03-Feb-15 21:28 by [name] [name], Cyndia Degraff [name] (MD)
